# Patient Record
Sex: Male | Born: 1975 | Race: White | Hispanic: No | Marital: Married | Smoking: Never smoker
Health system: Southern US, Community
[De-identification: ages and names within clinical notes are randomized; demographics above are authoritative.]

## PROBLEM LIST (undated history)

## (undated) DIAGNOSIS — J45909 Unspecified asthma, uncomplicated: Secondary | ICD-10-CM

## (undated) DIAGNOSIS — K219 Gastro-esophageal reflux disease without esophagitis: Secondary | ICD-10-CM

## (undated) HISTORY — PX: VASECTOMY: SHX75

---

## 2001-07-06 ENCOUNTER — Ambulatory Visit (HOSPITAL_COMMUNITY): Admission: RE | Admit: 2001-07-06 | Discharge: 2001-07-06 | Payer: Self-pay | Admitting: Family Medicine

## 2001-07-12 ENCOUNTER — Ambulatory Visit (HOSPITAL_COMMUNITY): Admission: RE | Admit: 2001-07-12 | Discharge: 2001-07-12 | Payer: Self-pay | Admitting: Family Medicine

## 2010-09-30 ENCOUNTER — Encounter: Admission: RE | Admit: 2010-09-30 | Discharge: 2010-09-30 | Payer: Self-pay | Admitting: Family Medicine

## 2011-06-10 IMAGING — US US SCROTUM
1 series · 13 of 25 positions shown · non-contrast
Comparison: None.

CLINICAL DATA: Palpable area inferior to the left testicle.

ULTRASOUND OF SCROTUM
TECHNIQUE: Complete ultrasound examination of the testicles,
epididymis, and other scrotal structures was performed.

[Series 1: us scrotum · 0.08mm/px · 13 of 48 slices shown]
[im 1/48]
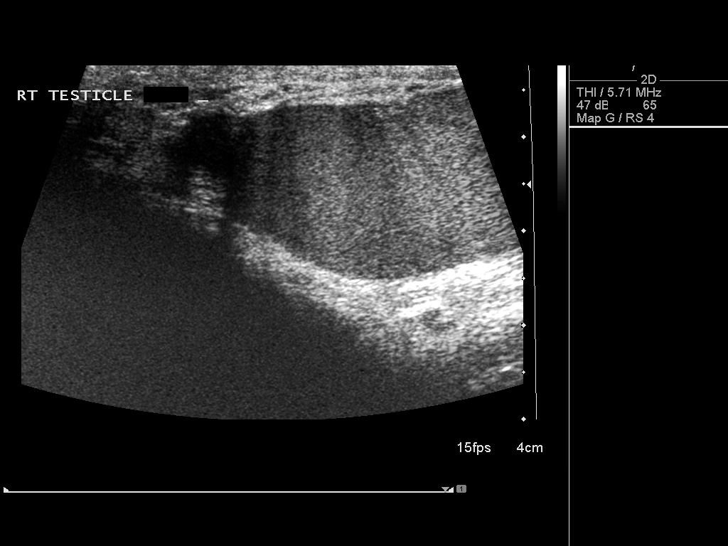
[im 4/48]
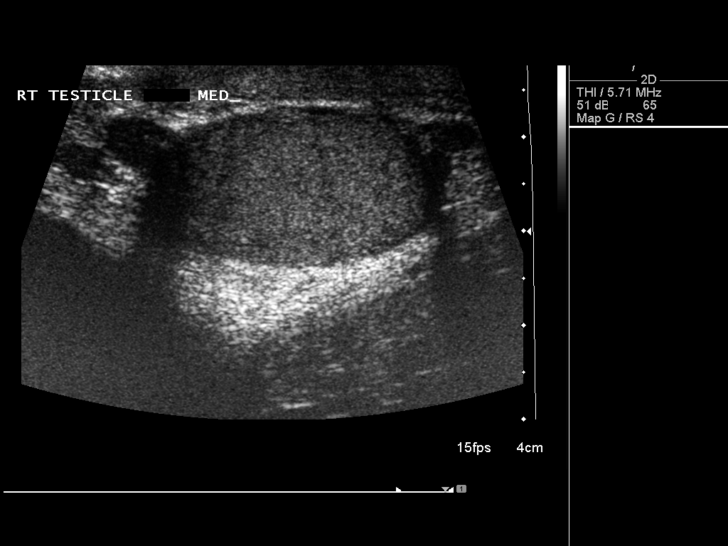
[im 8/48]
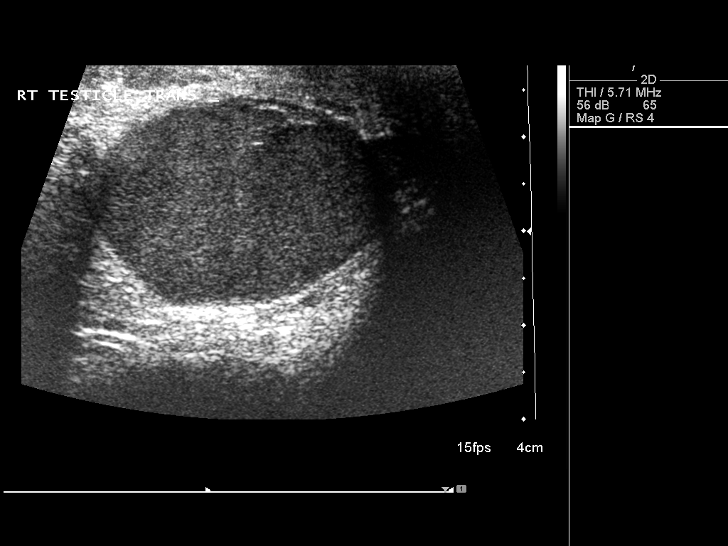
[im 12/48]
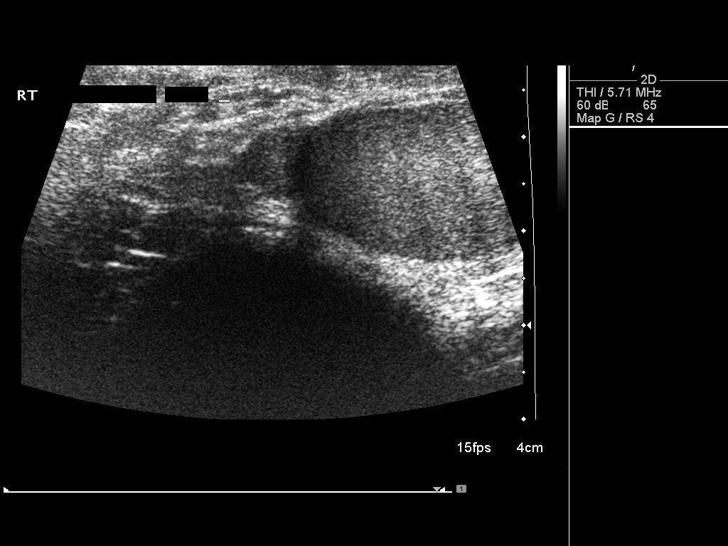
[im 16/48]
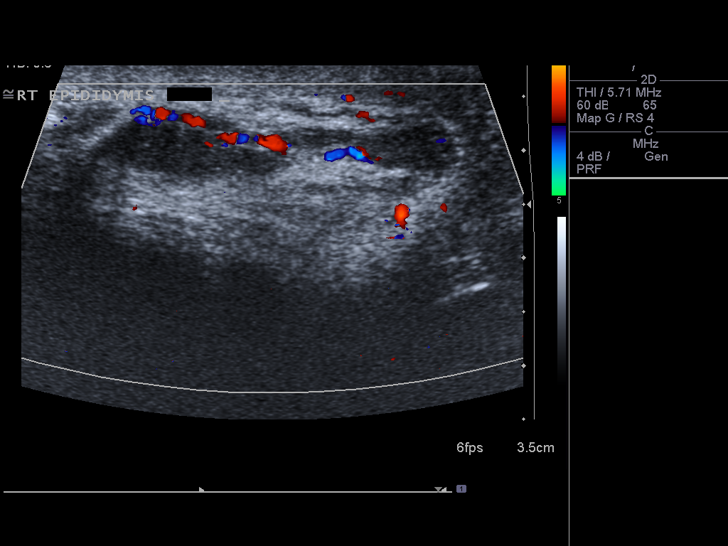
[im 20/48]
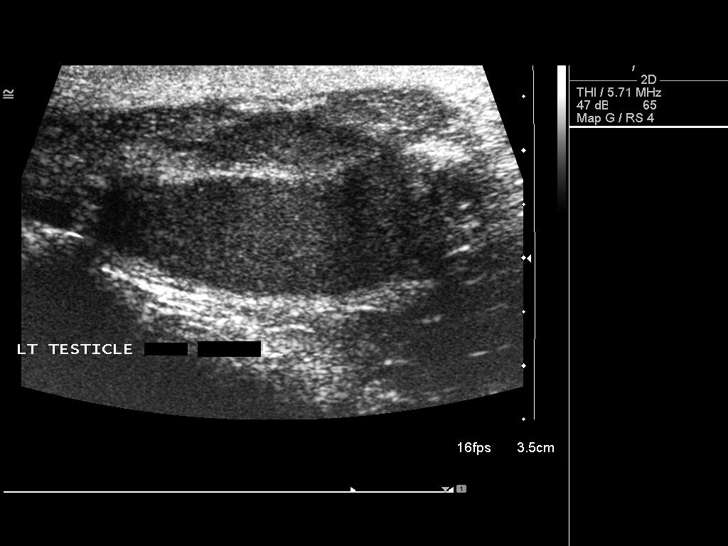
[im 24/48]
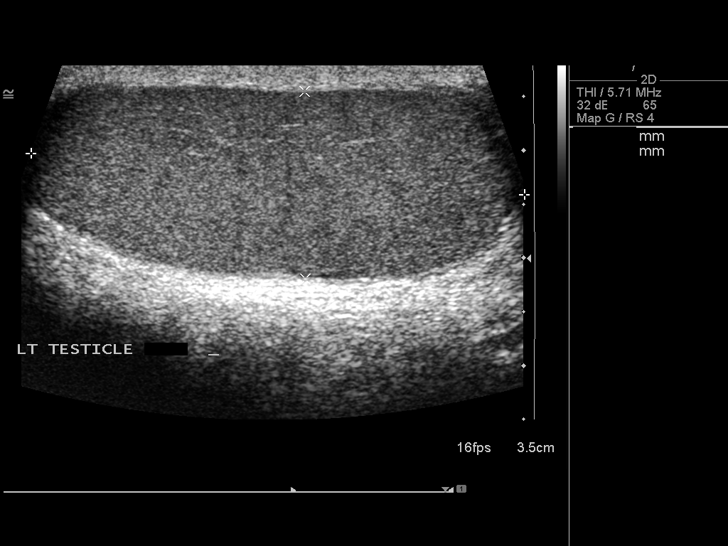
[im 28/48]
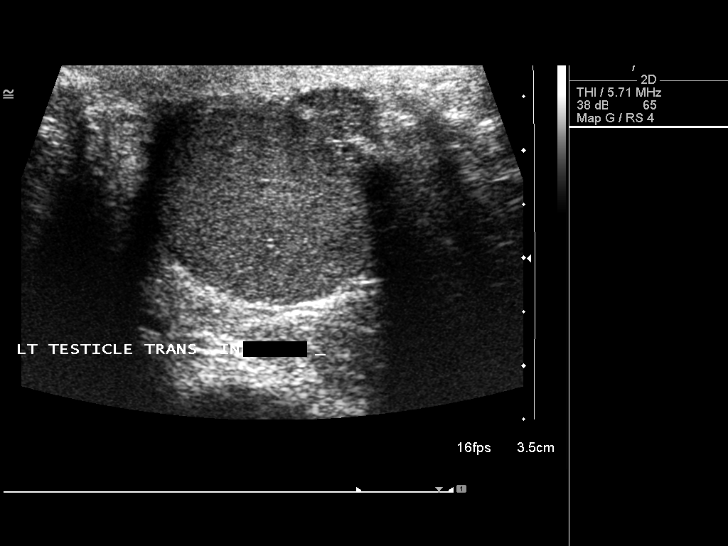
[im 32/48]
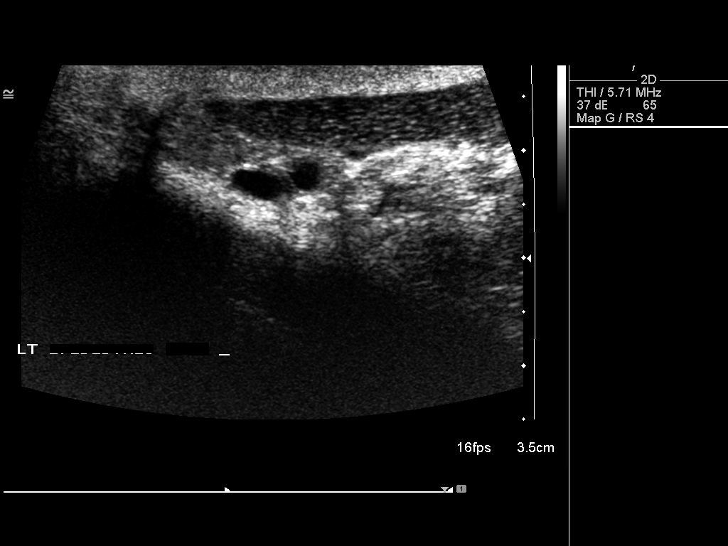
[im 36/48]
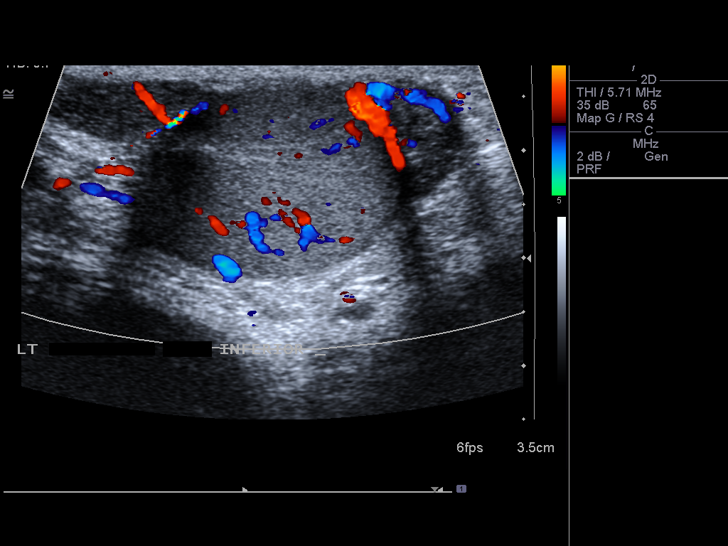
[im 40/48]
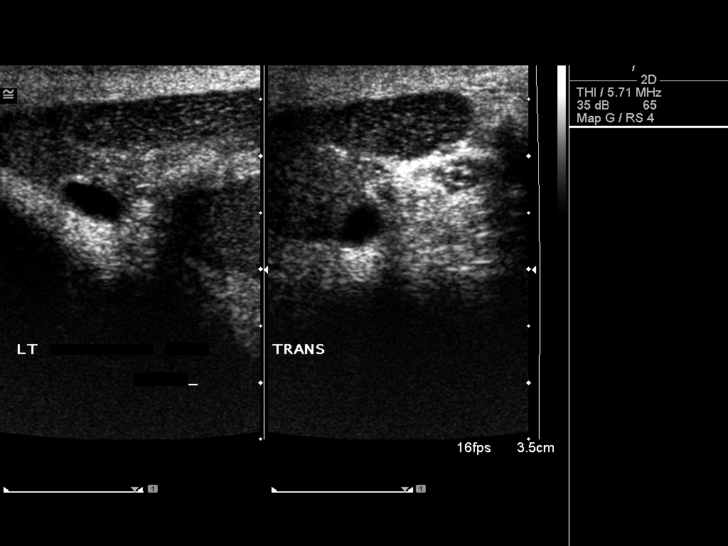
[im 44/48]
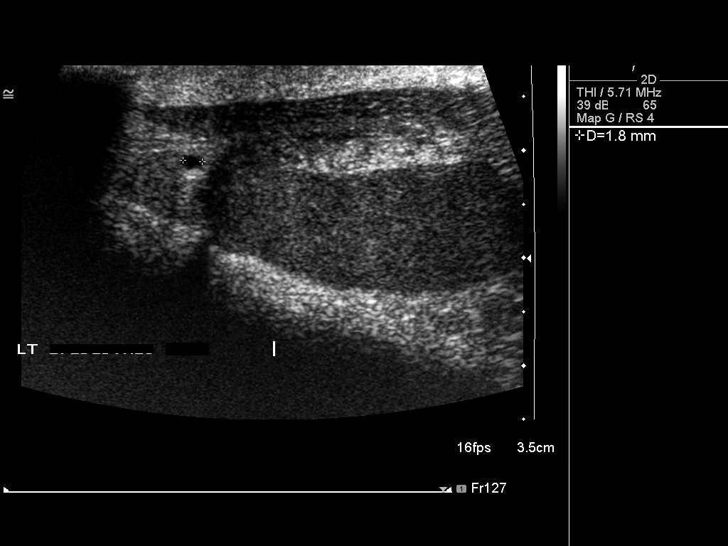
[im 48/48]
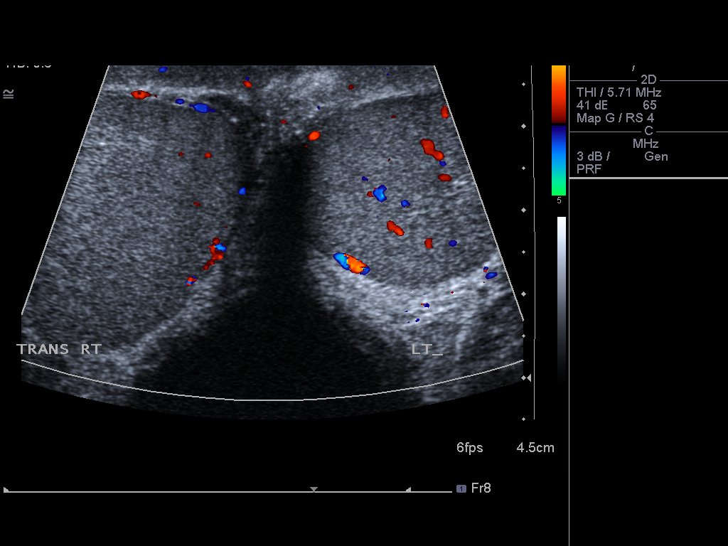

[13 of 25 positions shown; findings below may reference images not displayed]

FINDINGS: Right testis:  Measures 4.0 x 2.0 x 3.2 cm.  Normal echotexture in
the right testicle without a focal lesion.  There is normal color
Doppler flow within the right testicle.

Left testis:  Measures 4.6 x 1.9 x 2.8 cm.  There is normal color
Doppler flow within the left testicle.  No evidence for a left
intratesticular mass.

Right epididymis:  Normal in size and appearance.

Left epididymis:  Left epididymal head measures up to 1.1 cm.
There are least two small cysts in the left epididymal head and the
largest measures 0.5 cm.  The area of interest corresponds with
the inferior aspect of the epididymis.  The inferior epididymis is
hypoechoic and larger compared to the right side.  However, there
is no significant hyperemia within the epididymis.

Hydrocele:  Absent

Varicocele:  Absent
IMPRESSION: Negative for a testicular mass.

The palpable area of interest corresponds with an enlarged and
possibly edematous left epididymis.  The epididymis is not very
hyperemic which suggests that this finding may represent subacute
or old inflammation.

Small left epididymal head cysts.

## 2019-07-21 ENCOUNTER — Emergency Department (HOSPITAL_COMMUNITY)
Admission: EM | Admit: 2019-07-21 | Discharge: 2019-07-21 | Disposition: A | Discharge status: Home / Self Care | Payer: 59 | Attending: Emergency Medicine | Admitting: Emergency Medicine

## 2019-07-21 ENCOUNTER — Other Ambulatory Visit: Payer: Self-pay

## 2019-07-21 ENCOUNTER — Emergency Department (HOSPITAL_COMMUNITY): Payer: 59

## 2019-07-21 ENCOUNTER — Encounter (HOSPITAL_COMMUNITY): Payer: Self-pay

## 2019-07-21 DIAGNOSIS — Z79899 Other long term (current) drug therapy: Secondary | ICD-10-CM | POA: Diagnosis not present

## 2019-07-21 DIAGNOSIS — R059 Cough, unspecified: Secondary | ICD-10-CM

## 2019-07-21 DIAGNOSIS — U071 COVID-19: Secondary | ICD-10-CM | POA: Diagnosis not present

## 2019-07-21 DIAGNOSIS — R05 Cough: Secondary | ICD-10-CM

## 2019-07-21 MED ORDER — BENZONATATE 100 MG PO CAPS: 100.0000 mg | ORAL_CAPSULE | Freq: Three times a day (TID) | ORAL | 0 refills | Status: DC

## 2019-07-21 NOTE — ED Triage Notes (Signed)
Pt began feeling COVID symptoms last Wednesday, and pt tested positive last Saturday.  Pt states he has felt no improvement since the 9 days he began to feel sick.  Pt states his whole family tested COVID +.

## 2019-07-21 NOTE — ED Provider Notes (Signed)
[facility]   CSN: [id number] Arrival date & time: 07/21/19  1707    History   Chief Complaint Cough  HPI Riley Ferguson is a 43 y.o. male past medical history significant for asthma who presents for evaluation of cough.  Patient recently diagnosed with COVID 1 week ago.  States multiple family members have been sick.  Has continued to have productive cough of clear sputum.  Patient states he will get into "coughing fits" and not able to stop.  Does have history of asthma however has not used his rescue inhaler.  Does use Qvar daily for his asthma.  States he did have a temperature 2 days ago of 101 however is not taken his temperature since.  Is been using Tylenol and ibuprofen intermittently for body aches and pains and fever however last use approximately 2 days ago.  Is tolerating p.o. intake at home.  Has congestion and clear rhinorrhea.  Had 2 episodes of nonbloody diarrhea 1 week ago however none since.  Denies headache, neck pain, neck stiffness, chest pain, shortness of breath, hemoptysis, abdominal pain, nausea, vomiting, weakness.  Denies additional aggravating or alleviating factors.  States he called his PCP to see if he could have a prescription for cough medicine however PCP would not see patient due to his positive COVID status.  Does not take any chronic medicines, no history of PE, DVT, heart failure.  History obtained from patient and past medical records.  No interpreter was used.     HPI  History reviewed. No pertinent past medical history.  There are no active problems to display for this patient.   History reviewed. No pertinent surgical history.      Home Medications    Prior to Admission medications   Medication Sig Start Date End Date Taking? Authorizing Provider  albuterol (VENTOLIN HFA) 108 (90 Base) MCG/ACT inhaler Inhale 2 puffs into the lungs every 4 (four) hours as needed for wheezing or shortness of  breath.  02/23/19  Yes [provider]  ibuprofen (ADVIL) 200 MG tablet Take 400 mg by mouth every 6 (six) hours as needed for fever, mild pain or moderate pain.   Yes [provider]  Ibuprofen-diphenhydrAMINE HCl (ADVIL PM) 200-25 MG CAPS Take 1 tablet by mouth at bedtime.   Yes [provider]  levocetirizine (XYZAL) 5 MG tablet Take 5 mg by mouth every evening. 07/10/19  Yes [provider]  QVAR REDIHALER 80 MCG/ACT inhaler Inhale 1 puff into the lungs at bedtime. 04/15/19  Yes [provider]  benzonatate (TESSALON) 100 MG capsule Take 1 capsule (100 mg total) by mouth every 8 (eight) hours. 07/21/19   Denasia Venn A, PA-C    Family History No family history on file.  Social History Social History   Tobacco Use  . Smoking status: Not on file  Substance Use Topics  . Alcohol use: Yes    Alcohol/week: 2.0 standard drinks    Types: 2 Standard drinks or equivalent per week  . Drug use: Not on file     Allergies   Patient has no known allergies.   Review of Systems Review of Systems  Constitutional: Positive for fever (2 days ago).  HENT: Positive for congestion, postnasal drip and rhinorrhea. Negative for ear discharge, ear pain, facial swelling, sinus pressure, sinus pain, sneezing, sore throat, trouble swallowing and voice change.   Respiratory: Positive for cough. Negative for choking, chest tightness, shortness of breath, wheezing and  stridor.   Cardiovascular: Negative.   Gastrointestinal: Positive for diarrhea (1 week ago). Negative for abdominal pain, blood in stool, constipation, nausea, rectal pain and vomiting.  Genitourinary: Negative.   Musculoskeletal: Negative.   Skin: Negative.   Neurological: Negative.   All other systems reviewed and are negative.    Physical Exam Updated Vital Signs BP 132/77   Pulse 79   Temp 98.1 F (36.7 C) (Oral)   Resp (!) 21   Ht 5\' 9"  (1.753 m)   Wt 105.2 kg   SpO2 98%   BMI  34.26 kg/m   Physical Exam Vitals signs and nursing note reviewed.  Constitutional:      General: He is not in acute distress.    Appearance: He is well-developed. He is not ill-appearing, toxic-appearing or diaphoretic.  HENT:     Head: Normocephalic and atraumatic.     Jaw: There is normal jaw occlusion.     Right Ear: Tympanic membrane, ear canal and external ear normal. There is no impacted cerumen. No hemotympanum. Tympanic membrane is not injected, scarred, perforated, erythematous, retracted or bulging.     Left Ear: Tympanic membrane, ear canal and external ear normal. There is no impacted cerumen. No hemotympanum. Tympanic membrane is not injected, scarred, perforated, erythematous, retracted or bulging.     Nose:     Comments: Clear rhinorrhea and congestion to bilateral nares.  No sinus tenderness.    Mouth/Throat:     Comments: Posterior oropharynx clear.  Mucous membranes moist.  Tonsils without erythema or exudate.  Uvula midline without deviation.  No evidence of PTA or RPA.  No drooling, dysphasia or trismus.  Phonation normal. Eyes:     Pupils: Pupils are equal, round, and reactive to light.  Neck:     Musculoskeletal: Normal range of motion and neck supple.     Trachea: Trachea and phonation normal.     Comments: No Neck stiffness or neck rigidity.  No cervical lymphadenopathy. Cardiovascular:     Rate and Rhythm: Normal rate and regular rhythm.     Comments: No murmurs rubs or gallops. Pulmonary:     Effort: Pulmonary effort is normal. No respiratory distress.     Comments: Clear to auscultation bilaterally without wheeze, rhonchi or rales.  No accessory muscle usage.  Able speak in full sentences. Abdominal:     General: There is no distension.     Palpations: Abdomen is soft.     Comments: Soft, nontender without rebound or guarding.  No CVA tenderness.  Musculoskeletal: Normal range of motion.     Comments: Moves all 4 extremities without difficulty.  Lower  extremities without edema, erythema or warmth.  Skin:    General: Skin is warm and dry.     Comments: Brisk capillary refill.  No rashes or lesions.  Neurological:     Mental Status: He is alert.     Comments: Ambulatory in department without difficulty.  Cranial nerves II through XII grossly intact.  No facial droop.  No aphasia.      ED Treatments / Results  Labs (all labs ordered are listed, but only abnormal results are displayed) Labs Reviewed - No data to display  EKG None  Radiology Dg Chest Portable 1 View  Result Date: 07/21/2019 CLINICAL DATA:  COVID-19 positive patient.  Patient feel sick. EXAM: PORTABLE CHEST 1 VIEW COMPARISON:  None. FINDINGS: The heart size and mediastinal contours are within normal limits. Both lungs are clear. The visualized skeletal structures are unremarkable.  IMPRESSION: No active disease. Electronically Signed   By: Dorise Bullion III M.D   On: 07/21/2019 19:31    Procedures Procedures (including critical care time)  Medications Ordered in ED Medications - No data to display   Initial Impression / Assessment and Plan / ED Course  I have reviewed the triage vital signs and the nursing notes.  Pertinent labs & imaging results that were available during my care of the patient were reviewed by me and considered in my medical decision making (see chart for details).  43 year old male appears otherwise well presents for evaluation of cough.  He is afebrile, nonseptic, non-ill-appearing.  Diagnosed with COVID 1 week ago.  Tolerating p.o. intake without difficulty.  Heart and lungs clear.  Speaks in full sentences without difficulty.  No acute respiratory distress.  No neck stiffness or neck rigidity.  Does have clear rhinorrhea to bilateral nares.  Denies any chest pain, shortness of breath.  No evidence of DVT on exam.  Low suspicion for PE.  No hemoptysis. Diarrhea without melena or hematochezia 1 week ago however normal bowel movement since.   Abdomen soft, nontender without rebound or guarding.  On repeat exam patient does not have a surgical abdomin and there are no peritoneal signs.  No indication of appendicitis, bowel obstruction, bowel perforation, cholecystitis, diverticulitis on exam. He is without tachycardia, tachypnea or hypoxia.  Has a productive cough.  Will obtain chest x-ray to rule out secondary bacterial pneumonia.  He appears overall well.  Chest x-ray negative for acute infiltrates, cardiomegaly, pulmonary edema, pneumothorax.  Cough likely related to known COVID infection. He does not appear ill. He is without tachycardia, tachypnea or hypoxia. Ambulates in room with oxygen saturations greater than 98% on room air. No evidence of acute asthma exacerbation.  No new medications to suggest medication side effect as cause of cough. Will DC home with symptomatic management.  Discussed with patient to take his home albuterol inhaler as needed for bronchospasms.  Patient to return for any new or worsening symptoms.  The patient has been appropriately medically screened and/or stabilized in the ED. I have low suspicion for any other emergent medical condition which would require further screening, evaluation or treatment in the ED or require inpatient management.       Riley Ferguson was evaluated in Emergency Department on 07/21/2019 for the symptoms described in the history of present illness. He was evaluated in the context of the global COVID-19 pandemic, which necessitated consideration that the patient might be at risk for infection with the SARS-CoV-2 virus that causes COVID-19. Institutional protocols and algorithms that pertain to the evaluation of patients at risk for COVID-19 are in a state of rapid change based on information released by regulatory bodies including the CDC and federal and state organizations. These policies and algorithms were followed during the patient's care in the ED. Final Clinical Impressions(s) /  ED Diagnoses   Final diagnoses:  OEUMP-53 virus infection  Cough    ED Discharge Orders         Ordered    benzonatate (TESSALON) 100 MG capsule  Every 8 hours     07/21/19 1947           Breah Joa A, PA-C 07/21/19 1950    Daleen Bo, MD 07/22/19 1457

## 2019-07-21 NOTE — Discharge Instructions (Signed)
You may use your home albuterol inhaler as needed for bronchospasms or coughing.  I given you medication to help with your coughing.  Please take as prescribed.  If you develop severe chest pain, shortness of breath please seek reevaluation.  May also take Tylenol ibuprofen alternating for fever.  Do not take more than 4000 mg of Tylenol or more than 2400 mg of ibuprofen daily.

## 2020-03-30 IMAGING — DX PORTABLE CHEST - 1 VIEW
1 series · 1 of 1 positions shown · non-contrast
Comparison: None.

CLINICAL DATA: [id number]-[id number] positive patient.  Patient feel sick.

EXAM:
PORTABLE CHEST 1 VIEW

[chest ap]
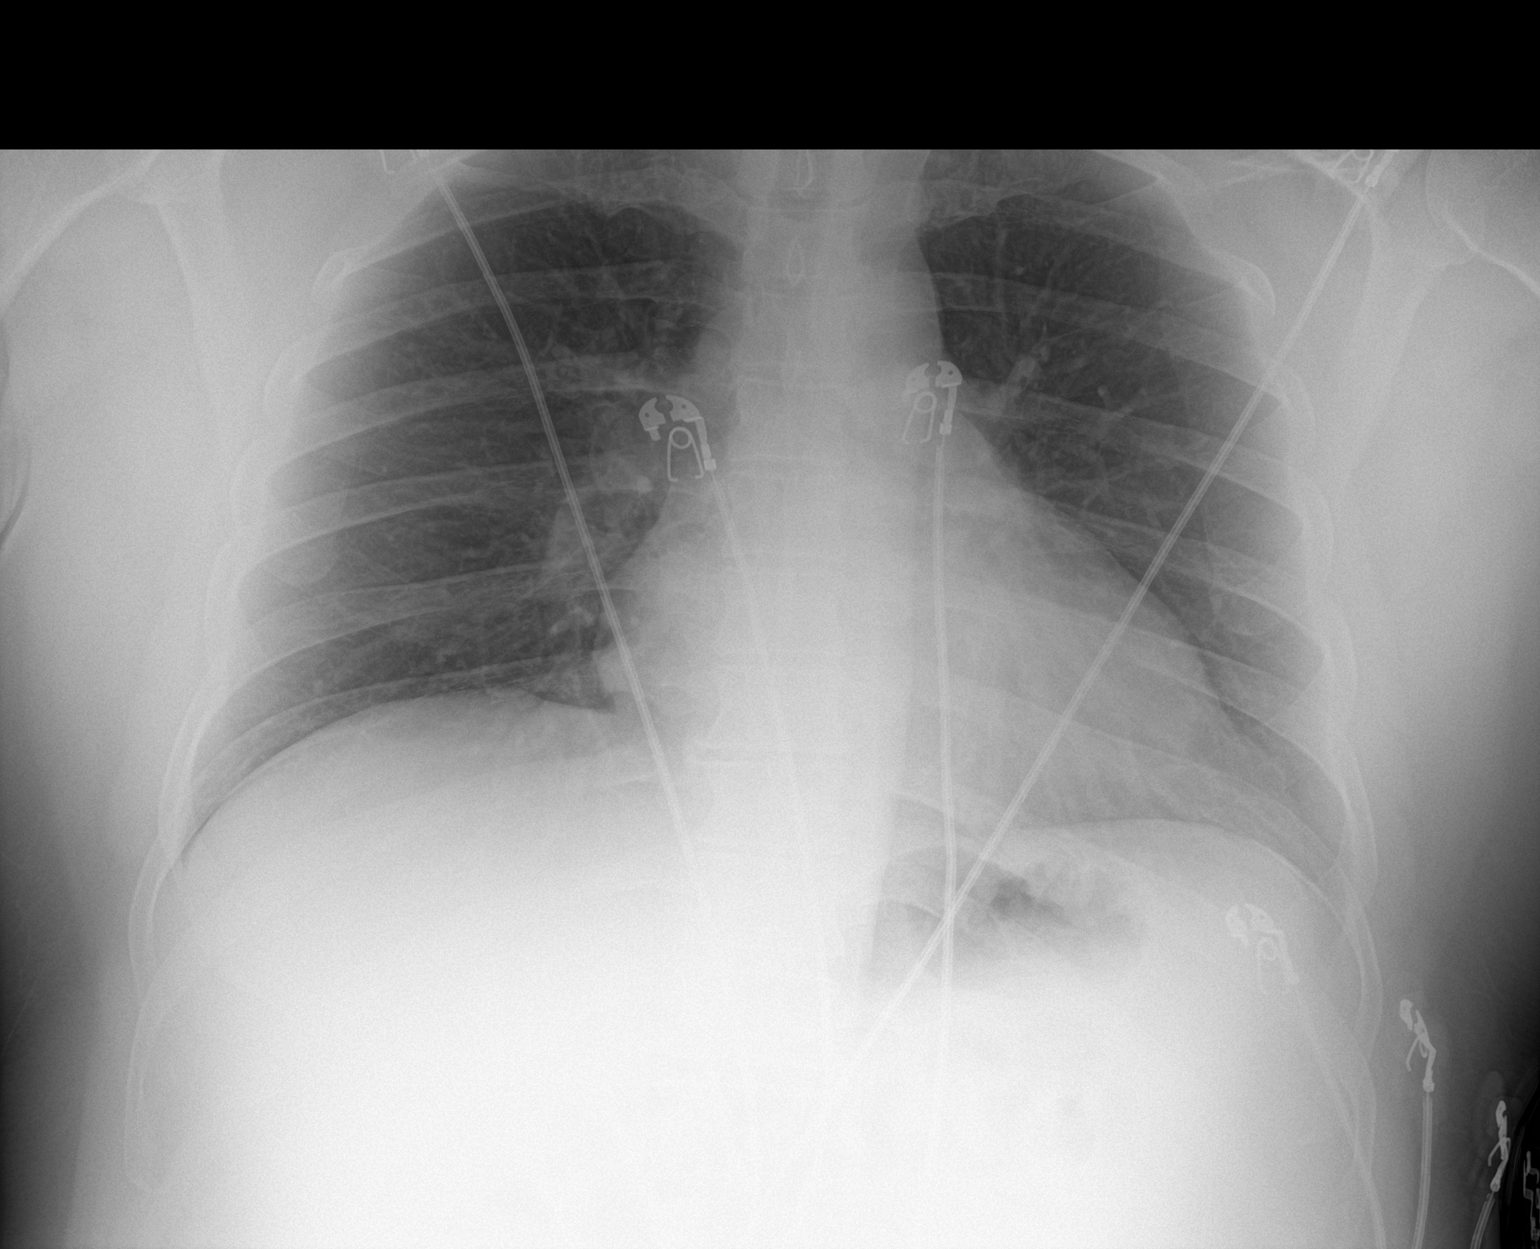

[1 of 1 positions shown; findings below may reference images not displayed]

FINDINGS: The heart size and mediastinal contours are within normal limits.
Both lungs are clear. The visualized skeletal structures are
unremarkable.
IMPRESSION: No active disease.

## 2022-04-21 ENCOUNTER — Ambulatory Visit (INDEPENDENT_AMBULATORY_CARE_PROVIDER_SITE_OTHER): Payer: 59 | Admitting: Internal Medicine

## 2022-04-21 ENCOUNTER — Encounter: Payer: Self-pay | Admitting: Internal Medicine

## 2022-04-21 VITALS — BP 116/74 | HR 99 | Temp 98.1°F | Ht 69.0 in | Wt 234.0 lb

## 2022-04-21 DIAGNOSIS — R053 Chronic cough: Secondary | ICD-10-CM

## 2022-04-21 DIAGNOSIS — Z8709 Personal history of other diseases of the respiratory system: Secondary | ICD-10-CM

## 2022-04-21 MED ORDER — PANTOPRAZOLE SODIUM 40 MG PO TBEC: 40.0000 mg | DELAYED_RELEASE_TABLET | Freq: Two times a day (BID) | ORAL | 3 refills | Status: DC

## 2022-04-21 NOTE — Patient Instructions (Addendum)
Please schedule follow up scheduled with myself in 2 months.  If my schedule is not open yet, we will contact you with a reminder closer to that time. Please call [phone] if you haven't heard from [place] a month before.   I think your cough is likely multi-factorial from reflux and post nasal drainage from allergies. Keep taking your allergy medications.  I will add prescription strength acid reflux medicine for the next 8 weeks to see if that makes a difference in cough. I will get records of the breathing testing from your allergist so you don't have to repeat.   What is GERD? Gastroesophageal reflux disease (GERD) is gastroesophageal reflux diseasewhich occurs when the lower esophageal sphincter (LES) opens spontaneously, for varying periods of time, or does not close properly and stomach contents rise up into the esophagus. GER is also called acid reflux or acid regurgitation, because digestive juices--called acids--rise up with the food. The esophagus is the tube that carries food from the mouth to the stomach. The LES is a ring of muscle at the bottom of the esophagus that acts like a valve between the esophagus and stomach.  When acid reflux occurs, food or fluid can be tasted in the back of the mouth. When refluxed stomach acid touches the lining of the esophagus it may cause a burning sensation in the chest or throat called heartburn or acid indigestion. Occasional reflux is common. Persistent reflux that occurs more than twice a week is considered GERD, and it can eventually lead to more serious health problems. People of all ages can have GERD. Studies have shown that GERD may worsen or contribute to asthma, chronic cough, and pulmonary fibrosis.   What are the symptoms of GERD? The main symptom of GERD in adults is frequent heartburn, also called acid indigestion--burning-type pain in the lower part of the mid-chest, behind the breast bone, and in the mid-abdomen.  Not all reflux is  acidic in nature, and many patients don't have heart burn at all. Sometimes it feels like a cough (either dry or with mucus), choking sensation, asthma, shortness of breath, waking up at night, frequent throat clearing, or trouble swallowing.    What causes GERD? The reason some people develop GERD is still unclear. However, research shows that in people with GERD, the LES relaxes while the rest of the esophagus is working. Anatomical abnormalities such as a hiatal hernia may also contribute to GERD. A hiatal hernia occurs when the upper part of the stomach and the LES move above the diaphragm, the muscle wall that separates the stomach from the chest. Normally, the diaphragm helps the LES keep acid from rising up into the esophagus. When a hiatal hernia is present, acid reflux can occur more easily. A hiatal hernia can occur in people of any age and is most often a normal finding in otherwise healthy people over age 47. Most of the time, a hiatal hernia produces no symptoms.   Other factors that may contribute to GERD include - Obesity or recent weight gain - Pregnancy  - Smoking  - Diet - Certain medications  Common foods that can worsen reflux symptoms include: - carbonated beverages - artificial sweeteners - citrus fruits  - chocolate  - drinks with caffeine or alcohol  - fatty and fried foods  - garlic and onions  - mint flavorings  - spicy foods  - tomato-based foods, like spaghetti sauce, salsa, chili, and pizza   Lifestyle Changes If you smoke, stop.  Avoid foods and beverages that worsen symptoms (see above.) Lose weight if needed.  Eat small, frequent meals.  Wear loose-fitting clothes.  Avoid lying down for 3 hours after a meal.  Raise the head of your bed 6 to 8 inches by securing wood blocks under the bedposts. Just using extra pillows will not help, but using a wedge-shaped pillow may be helpful.  Medications  H2 blockers, such as cimetidine (Tagamet HB), famotidine  (Pepcid AC), nizatidine (Axid AR), and ranitidine (Zantac 75), decrease acid production. They are available in prescription strength and over-the-counter strength. These drugs provide short-term relief and are effective for about half of those who have GERD symptoms.  Proton pump inhibitors include omeprazole (Prilosec, Zegerid), lansoprazole (Prevacid), pantoprazole (Protonix), rabeprazole (Aciphex), and esomeprazole (Nexium), which are available by prescription. Prilosec is also available in over-the-counter strength. Proton pump inhibitors are more effective than H2 blockers and can relieve symptoms and heal the esophageal lining in almost everyone who has GERD.  Because drugs work in different ways, combinations of medications may help control symptoms. People who get heartburn after eating may take both antacids and H2 blockers. The antacids work first to neutralize the acid in the stomach, and then the H2 blockers act on acid production. By the time the antacid stops working, the H2 blocker will have stopped acid production. Your health care provider is the best source of information about how to use medications for GERD.   Points to Remember 1. You can have GERD without having heartburn. Your symptoms could include a dry cough, asthma symptoms, or trouble swallowing.  2. Taking medications daily as prescribed is important in controlling you symptoms.  Sometimes it can take up to 8 weeks to fully achieve the effects of the medications prescribed.  3. Coughing related to GERD can be difficult to treat and is very frustrating!  However, it is important to stick with these medications and lifestyle modifications before pursuing more aggressive or invasive test and treatments.

## 2022-04-21 NOTE — Progress Notes (Signed)
Riley Ferguson    [id number]    May 13, 1976  Primary Care Physician:Patient, No Pcp Per (Inactive)  Referring Physician: Dennie Maizes, NP [address] [place],  [place] [address] Reason for Consultation: cough Date of Consultation: 04/21/2022  Chief complaint:   Chief Complaint  Patient presents with   Consult    He has a dry cough for a few months, shortness of breath with exertion.      HPI: Riley Ferguson is a 46 y.o. man who presents for new patient evaluation of cough.  He first had covid in fall 2020 and then was treated with symbicort and this helped his cough a lot at the time.   He had covid a second time.  He also saw an allergist who tried a couple different inhalers - advair, qvar. Nothing helped.  He doesn't feel albuterol helps him at all for cough or shortness of breath. He never feels like he has gotten a response. Symbicort takes 3-4 weeks for him to feel better. He doesn't feel that symbicort completely relieves his symptoms. Misses evening dose 30% of the time.   He has a past medical history of asthma. He was diagnosed in childhood around age 27. Then symptoms went away for many years. He does note seasonal allergies and takes singulair and xyzal. Xyzal helped his cough a lot.    He does have chest tightness and heart burn. Took 2 weeks of nexium  over the counter and doesn't notice any difference in his cough, but it has helped in the past. He describes an itching in his chest that feels different from his allergy cough.   He does have frequent throat clearing. He wakes up in the morning coughing. He coughs worse after eating. He has runny nose after eating too.   He has taken prednisone for cough. Does not think it helped.   Current Regimen: symbicort 2 puffs twice a day, prn albuterol Asthma Triggers: seasonal allergies.  Exacerbations in the last year: none History of hospitalization or intubation: none Allergy Testing: GERD: yes.   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  Exposures: lives at home with dog, wife's kids, wife.  Smoking history: never smoker, passive smoke exposure as an adult for 6 years in a partner  Social History   Occupational History   Not on file  Tobacco Use   Smoking status: Never    Passive exposure: Never   Smokeless tobacco: Never  Substance and Sexual Activity   Alcohol use: Yes    Alcohol/week: 2.0 standard drinks    Types: 2 Standard drinks or equivalent per week   Drug use: Never   Sexual activity: Not Currently    Relevant family history:  Family History  Problem Relation Age of Onset   Asthma Mother    Asthma Brother     History reviewed. No pertinent past medical history.  Past Surgical History:  Procedure Laterality Date   VASECTOMY       Physical Exam: Blood pressure 116/74, pulse 99, temperature 98.1 F (36.7 C), temperature source Oral, height 5\' 9"  (1.753 m), weight 234 lb (106.1 kg), SpO2 97 %. Gen:      No acute distress ENT:  no nasal polyps, mucus membranes moist, mild cobblestoning in oropharynx Lungs:  No increased respiratory effort, symmetric chest wall excursion, clear to auscultation bilaterally, no wheezes or crackles CV:         Regular rate and rhythm; no murmurs, rubs, or gallops.  No pedal edema Abd:      + bowel sounds; soft, non-tender; no distension MSK: no acute synovitis of DIP or PIP joints, no mechanics hands.  Skin:      Warm and dry; no rashes Neuro: normal speech, no focal facial asymmetry Psych: alert and oriented x3, normal mood and affect   Data Reviewed/Medical Decision Making:  Independent interpretation of tests: Imaging:  Review of patient's chest xray images august 2020 revealed no acute process. The patient's images have been independently reviewed by me.     PFTs:      View : No data to display.          Labs: CBC and CMP reviewed from May 2022 from PCP's office wnl  Immunization status:  Immunization History  Administered Date(s) Administered   PFIZER(Purple Top)SARS-COV-2 Vaccination 02/20/2020, 03/07/2020   Tdap 09/24/2010, 03/26/2021     I reviewed prior external note(s) from [facility] [place] [facility]  I reviewed the result(s) of the labs and imaging as noted above.   I have ordered    Assessment:  Chronic Cough History Asthma  Plan/Recommendations:  I think your cough is likely multi-factorial from reflux and post nasal drainage from allergies. Keep taking your allergy medications.  I will add prescription strength acid reflux medicine for the next 8 weeks to see if that makes a difference in cough. I will get records of the breathing testing from your allergist so you don't have to repeat.  Reasonable to continue asthma therapy for now. The failure to improve with albuterol and prednisone means it's much less likely cough is related to asthma.   We discussed disease management and progression at length today.     Return to Care: Return in about 2 months (around 06/21/2022).  Durel Salts, MD Pulmonary and Critical Care Medicine [facility] [phone]  CC: Dennie Maizes, NP

## 2022-06-22 ENCOUNTER — Ambulatory Visit (INDEPENDENT_AMBULATORY_CARE_PROVIDER_SITE_OTHER): Payer: 59 | Admitting: Internal Medicine

## 2022-06-22 ENCOUNTER — Encounter: Payer: Self-pay | Admitting: Internal Medicine

## 2022-06-22 VITALS — BP 138/76 | HR 67 | Temp 97.8°F | Ht 69.0 in | Wt 234.2 lb

## 2022-06-22 DIAGNOSIS — J301 Allergic rhinitis due to pollen: Secondary | ICD-10-CM

## 2022-06-22 DIAGNOSIS — K219 Gastro-esophageal reflux disease without esophagitis: Secondary | ICD-10-CM

## 2022-06-22 MED ORDER — PANTOPRAZOLE SODIUM 40 MG PO TBEC: 40.0000 mg | DELAYED_RELEASE_TABLET | Freq: Two times a day (BID) | ORAL | 11 refills | Status: AC

## 2022-06-22 NOTE — Patient Instructions (Signed)
Please schedule follow up scheduled with myself in 1 year.  If my schedule is not open yet, we will contact you with a reminder closer to that time. Please call [phone] if you haven't heard from [place] a month before.   Continue acid reflux medicine twice daily. You can try backing off to once a day after a few weeks and see if it still relieves your symptoms. Uncontrolled reflux can lead to precancerous changes in your esophagus called Barrett's Esophagus.   What is GERD? Gastroesophageal reflux disease (GERD) is gastroesophageal reflux diseasewhich occurs when the lower esophageal sphincter (LES) opens spontaneously, for varying periods of time, or does not close properly and stomach contents rise up into the esophagus. GER is also called acid reflux or acid regurgitation, because digestive juices--called acids--rise up with the food. The esophagus is the tube that carries food from the mouth to the stomach. The LES is a ring of muscle at the bottom of the esophagus that acts like a valve between the esophagus and stomach.  When acid reflux occurs, food or fluid can be tasted in the back of the mouth. When refluxed stomach acid touches the lining of the esophagus it may cause a burning sensation in the chest or throat called heartburn or acid indigestion. Occasional reflux is common. Persistent reflux that occurs more than twice a week is considered GERD, and it can eventually lead to more serious health problems. People of all ages can have GERD. Studies have shown that GERD may worsen or contribute to asthma, chronic cough, and pulmonary fibrosis.   What are the symptoms of GERD? The main symptom of GERD in adults is frequent heartburn, also called acid indigestion--burning-type pain in the lower part of the mid-chest, behind the breast bone, and in the mid-abdomen.  Not all reflux is acidic in nature, and many patients don't have heart burn at all. Sometimes it feels like a cough (either dry or  with mucus), choking sensation, asthma, shortness of breath, waking up at night, frequent throat clearing, or trouble swallowing.    What causes GERD? The reason some people develop GERD is still unclear. However, research shows that in people with GERD, the LES relaxes while the rest of the esophagus is working. Anatomical abnormalities such as a hiatal hernia may also contribute to GERD. A hiatal hernia occurs when the upper part of the stomach and the LES move above the diaphragm, the muscle wall that separates the stomach from the chest. Normally, the diaphragm helps the LES keep acid from rising up into the esophagus. When a hiatal hernia is present, acid reflux can occur more easily. A hiatal hernia can occur in people of any age and is most often a normal finding in otherwise healthy people over age 16. Most of the time, a hiatal hernia produces no symptoms.   Other factors that may contribute to GERD include - Obesity or recent weight gain - Pregnancy  - Smoking  - Diet - Certain medications  Common foods that can worsen reflux symptoms include: - carbonated beverages - artificial sweeteners - citrus fruits  - chocolate  - drinks with caffeine or alcohol  - fatty and fried foods  - garlic and onions  - mint flavorings  - spicy foods  - tomato-based foods, like spaghetti sauce, salsa, chili, and pizza   Lifestyle Changes If you smoke, stop.  Avoid foods and beverages that worsen symptoms (see above.) Lose weight if needed.  Eat small, frequent meals.  Wear loose-fitting clothes.  Avoid lying down for 3 hours after a meal.  Raise the head of your bed 6 to 8 inches by securing wood blocks under the bedposts. Just using extra pillows will not help, but using a wedge-shaped pillow may be helpful.  Medications  H2 blockers, such as cimetidine (Tagamet HB), famotidine (Pepcid AC), nizatidine (Axid AR), and ranitidine (Zantac 75), decrease acid production. They are available in  prescription strength and over-the-counter strength. These drugs provide short-term relief and are effective for about half of those who have GERD symptoms.  Proton pump inhibitors include omeprazole (Prilosec, Zegerid), lansoprazole (Prevacid), pantoprazole (Protonix), rabeprazole (Aciphex), and esomeprazole (Nexium), which are available by prescription. Prilosec is also available in over-the-counter strength. Proton pump inhibitors are more effective than H2 blockers and can relieve symptoms and heal the esophageal lining in almost everyone who has GERD.  Because drugs work in different ways, combinations of medications may help control symptoms. People who get heartburn after eating may take both antacids and H2 blockers. The antacids work first to neutralize the acid in the stomach, and then the H2 blockers act on acid production. By the time the antacid stops working, the H2 blocker will have stopped acid production. Your health care provider is the best source of information about how to use medications for GERD.   Points to Remember 1. You can have GERD without having heartburn. Your symptoms could include a dry cough, asthma symptoms, or trouble swallowing.  2. Taking medications daily as prescribed is important in controlling you symptoms.  Sometimes it can take up to 8 weeks to fully achieve the effects of the medications prescribed.  3. Coughing related to GERD can be difficult to treat and is very frustrating!  However, it is important to stick with these medications and lifestyle modifications before pursuing more aggressive or invasive test and treatments.

## 2022-06-22 NOTE — Progress Notes (Unsigned)
         Riley Ferguson    [id number]    October 21, 1976  Primary Care Physician:Patient, No Pcp Per Date of Appointment: 06/23/2022 Established Patient Visit  Chief complaint:   Chief Complaint  Patient presents with   Follow-up    Cough resolved      HPI: Riley Ferguson a is a 46 y.o. man with chronic cough.  Interval Updates: Here for follow up. Cough is resolved since starting PPI BID. Thinks it was all related to reflux. He denotes eating a fairly liberal diet when it comes to caffeine, alcohol, carbonated beverages, fried foods.  Does have some seasonal post nasal drainage but this year it just never went away until he tried the PPI.   I have reviewed the patient's family social and past medical history and updated as appropriate.   History reviewed. No pertinent past medical history.  Past Surgical History:  Procedure Laterality Date   VASECTOMY      Family History  Problem Relation Age of Onset   Asthma Mother    Asthma Brother     Social History   Occupational History   Not on file  Tobacco Use   Smoking status: Never    Passive exposure: Never   Smokeless tobacco: Never  Substance and Sexual Activity   Alcohol use: Yes    Alcohol/week: 2.0 standard drinks of alcohol    Types: 2 Standard drinks or equivalent per week   Drug use: Never   Sexual activity: Not Currently     Physical Exam: Blood pressure 138/76, pulse 67, temperature 97.8 F (36.6 C), temperature source Oral, height 5\' 9"  (1.753 m), weight 234 lb 3.2 oz (106.2 kg), SpO2 99 %.  Gen:      No acute distress ENT:  no nasal polyps, mucus membranes moist, mallampati IV Lungs:    No increased respiratory effort, symmetric chest wall excursion, clear to auscultation bilaterally, no wheezes or crackles CV:         Regular rate and rhythm; no murmurs, rubs, or gallops.  No pedal edema   Data Reviewed: Imaging: I have personally reviewed the   PFTs:      No data to display          I have personally reviewed the patient's PFTs and wnl in 2023  Labs:  Immunization status: Immunization History  Administered Date(s) Administered   PFIZER(Purple Top)SARS-COV-2 Vaccination 02/20/2020, 03/07/2020   Tdap 09/24/2010, 03/26/2021    External Records Personally Reviewed:   Assessment:  Chronic Cough - GERD Chronic allergic rhinitis  Plan/Recommendations: Continue PPI BID - would taper off to once daily in a few weeks and see if still efficacious. Discussed lifestyle modification at length for reflux.  Continue allergy medication   Return to Care: Return in about 1 year (around 06/23/2023).   06/25/2023, MD Pulmonary and Critical Care Medicine [facility] [place] [facility] Office:[phone]

## 2022-06-23 ENCOUNTER — Encounter: Payer: Self-pay | Admitting: Internal Medicine

## 2022-07-21 ENCOUNTER — Other Ambulatory Visit: Payer: Self-pay | Admitting: Urology

## 2022-09-07 ENCOUNTER — Encounter (HOSPITAL_BASED_OUTPATIENT_CLINIC_OR_DEPARTMENT_OTHER): Payer: Self-pay | Admitting: Urology

## 2022-09-07 NOTE — Progress Notes (Signed)
Spoke w/ via phone for pre-op interview--- Riley Ferguson Lab needs dos----   NONE            Lab results------ COVID test -----patient states asymptomatic no test needed Arrive at -------1215 NPO after MN NO Solid Food.  Clear liquids from MN until---1115 Med rec completed Medications to take morning of surgery -----Symbicort and Protonix Diabetic medication ----- Patient instructed no nail polish to be worn day of surgery Patient instructed to bring photo id and insurance card day of surgery Patient aware to have Driver (ride ) / caregiver  Wife Riley Ferguson  for 24 hours after surgery  Patient Special Instructions ----- Pre-Op special Istructions ----- Patient verbalized understanding of instructions that were given at this phone interview. Patient denies shortness of breath, chest pain, fever, cough at this phone interview.

## 2022-09-15 ENCOUNTER — Ambulatory Visit (HOSPITAL_BASED_OUTPATIENT_CLINIC_OR_DEPARTMENT_OTHER): Admission: RE | Admit: 2022-09-15 | Payer: 59 | Source: Ambulatory Visit | Admitting: Urology

## 2022-09-15 HISTORY — DX: Unspecified asthma, uncomplicated: J45.909

## 2022-09-15 HISTORY — DX: Gastro-esophageal reflux disease without esophagitis: K21.9

## 2022-09-15 SURGERY — CIRCUMCISION, ADULT
Anesthesia: General

## 2023-08-13 ENCOUNTER — Other Ambulatory Visit: Payer: Self-pay | Admitting: Urology

## 2023-09-28 ENCOUNTER — Encounter (HOSPITAL_BASED_OUTPATIENT_CLINIC_OR_DEPARTMENT_OTHER): Payer: Self-pay | Admitting: Urology

## 2023-09-28 NOTE — Progress Notes (Signed)
Spoke w/ via phone for pre-op interview--- Riley Ferguson needs dos----  NONE       Ferguson results------ COVID test -----patient states asymptomatic no test needed Arrive at -------1100 NPO after MN NO Solid Food.  Clear liquids from MN until---1000 Med rec completed Medications to take morning of surgery -----Symbicort, Protonix and Xyzal. Diabetic medication ----- Patient instructed no nail polish to be worn day of surgery Patient instructed to bring photo id and insurance card day of surgery Patient aware to have Driver (ride ) / caregiver    for 24 hours after surgery - wife- Riley Ferguson Patient Special Instructions ----- Pre-Op special Instructions ----- Patient verbalized understanding of instructions that were given at this phone interview. Patient denies chest pain, sob, fever, cough at the interview.

## 2023-10-04 NOTE — Anesthesia Preprocedure Evaluation (Signed)
Anesthesia Evaluation  Patient identified by MRN, date of birth, ID band Patient awake    Reviewed: Allergy & Precautions, NPO status , Patient's Chart, lab work & pertinent test results  Airway Mallampati: IV  TM Distance: >3 FB Neck ROM: Full    Dental  (+) Teeth Intact, Dental Advisory Given   Pulmonary asthma (symbicort)    Pulmonary exam normal breath sounds clear to auscultation       Cardiovascular negative cardio ROS Normal cardiovascular exam Rhythm:Regular Rate:Normal     Neuro/Psych negative neurological ROS  negative psych ROS   GI/Hepatic Neg liver ROS,GERD  Medicated and Controlled,,  Endo/Other  Obesity BMI 35  Renal/GU negative Renal ROS  negative genitourinary   Musculoskeletal negative musculoskeletal ROS (+)    Abdominal  (+) + obese  Peds  Hematology negative hematology ROS (+)   Anesthesia Other Findings   Reproductive/Obstetrics negative OB ROS                             Anesthesia Physical Anesthesia Plan  ASA: 2  Anesthesia Plan: General   Post-op Pain Management: Tylenol PO (pre-op)*, Toradol IV (intra-op)* and Precedex   Induction: Intravenous  PONV Risk Score and Plan: 3 and Ondansetron, Dexamethasone, Midazolam and Treatment may vary due to age or medical condition  Airway Management Planned: LMA  Additional Equipment: None  Intra-op Plan:   Post-operative Plan: Extubation in OR  Informed Consent: I have reviewed the patients History and Physical, chart, labs and discussed the procedure including the risks, benefits and alternatives for the proposed anesthesia with the patient or authorized representative who has indicated his/her understanding and acceptance.     Dental advisory given  Plan Discussed with: CRNA  Anesthesia Plan Comments:        Anesthesia Quick Evaluation

## 2023-10-05 ENCOUNTER — Ambulatory Visit (HOSPITAL_BASED_OUTPATIENT_CLINIC_OR_DEPARTMENT_OTHER): Payer: Self-pay | Admitting: Anesthesiology

## 2023-10-05 ENCOUNTER — Encounter (HOSPITAL_BASED_OUTPATIENT_CLINIC_OR_DEPARTMENT_OTHER)
Admission: RE | Disposition: A | Discharge status: Home / Self Care | Payer: Self-pay | Source: Home / Self Care | Attending: Urology

## 2023-10-05 ENCOUNTER — Encounter (HOSPITAL_BASED_OUTPATIENT_CLINIC_OR_DEPARTMENT_OTHER): Payer: Self-pay | Admitting: Urology

## 2023-10-05 ENCOUNTER — Ambulatory Visit (HOSPITAL_BASED_OUTPATIENT_CLINIC_OR_DEPARTMENT_OTHER)
Admission: RE | Admit: 2023-10-05 | Discharge: 2023-10-05 | Disposition: A | Discharge status: Home / Self Care | Payer: 59 | Attending: Urology | Admitting: Urology

## 2023-10-05 ENCOUNTER — Other Ambulatory Visit: Payer: Self-pay

## 2023-10-05 ENCOUNTER — Ambulatory Visit (HOSPITAL_BASED_OUTPATIENT_CLINIC_OR_DEPARTMENT_OTHER): Payer: 59 | Admitting: Anesthesiology

## 2023-10-05 DIAGNOSIS — K219 Gastro-esophageal reflux disease without esophagitis: Secondary | ICD-10-CM | POA: Diagnosis not present

## 2023-10-05 DIAGNOSIS — N475 Adhesions of prepuce and glans penis: Secondary | ICD-10-CM | POA: Diagnosis present

## 2023-10-05 DIAGNOSIS — N4889 Other specified disorders of penis: Secondary | ICD-10-CM

## 2023-10-05 DIAGNOSIS — Z7951 Long term (current) use of inhaled steroids: Secondary | ICD-10-CM | POA: Insufficient documentation

## 2023-10-05 DIAGNOSIS — J45909 Unspecified asthma, uncomplicated: Secondary | ICD-10-CM | POA: Insufficient documentation

## 2023-10-05 HISTORY — PX: CIRCUMCISION: SHX1350

## 2023-10-05 SURGERY — CIRCUMCISION, ADULT
Anesthesia: General | Site: Penis

## 2023-10-05 MED ORDER — CELECOXIB 200 MG PO CAPS: 200.0000 mg | ORAL_CAPSULE | Freq: Two times a day (BID) | ORAL | 1 refills | Status: AC

## 2023-10-05 MED ORDER — ONDANSETRON HCL 4 MG/2ML IJ SOLN: 4.0000 mg | Freq: Once | INTRAMUSCULAR | Status: DC | PRN

## 2023-10-05 MED ORDER — HYDROMORPHONE HCL 1 MG/ML IJ SOLN: 0.2500 mg | INTRAMUSCULAR | Status: DC | PRN

## 2023-10-05 MED ORDER — PROPOFOL 10 MG/ML IV BOLUS
INTRAVENOUS | Status: AC
  Filled 2023-10-05: qty 20

## 2023-10-05 MED ORDER — LIDOCAINE HCL (CARDIAC) PF 100 MG/5ML IV SOSY
PREFILLED_SYRINGE | INTRAVENOUS | Status: DC | PRN
  Administered 2023-10-05: 50 mg via INTRAVENOUS

## 2023-10-05 MED ORDER — AMISULPRIDE (ANTIEMETIC) 5 MG/2ML IV SOLN: 10.0000 mg | Freq: Once | INTRAVENOUS | Status: DC | PRN

## 2023-10-05 MED ORDER — FENTANYL CITRATE (PF) 100 MCG/2ML IJ SOLN
INTRAMUSCULAR | Status: DC | PRN
  Administered 2023-10-05: 100 ug via INTRAVENOUS

## 2023-10-05 MED ORDER — MIDAZOLAM HCL 2 MG/2ML IJ SOLN
INTRAMUSCULAR | Status: DC | PRN
  Administered 2023-10-05: 2 mg via INTRAVENOUS

## 2023-10-05 MED ORDER — TRAMADOL HCL 50 MG PO TABS: 50.0000 mg | ORAL_TABLET | Freq: Four times a day (QID) | ORAL | 0 refills | Status: AC | PRN

## 2023-10-05 MED ORDER — ACETAMINOPHEN 500 MG PO TABS
ORAL_TABLET | ORAL | Status: AC
Start: 2023-10-05 — End: ?
  Filled 2023-10-05: qty 2

## 2023-10-05 MED ORDER — CEFAZOLIN SODIUM-DEXTROSE 2-4 GM/100ML-% IV SOLN
INTRAVENOUS | Status: AC
  Filled 2023-10-05: qty 100

## 2023-10-05 MED ORDER — DEXAMETHASONE SODIUM PHOSPHATE 4 MG/ML IJ SOLN
INTRAMUSCULAR | Status: DC | PRN
  Administered 2023-10-05: 8 mg via INTRAVENOUS

## 2023-10-05 MED ORDER — MIDAZOLAM HCL 2 MG/2ML IJ SOLN
INTRAMUSCULAR | Status: AC
Start: 2023-10-05 — End: ?
  Filled 2023-10-05: qty 2

## 2023-10-05 MED ORDER — DEXMEDETOMIDINE HCL IN NACL 80 MCG/20ML IV SOLN
INTRAVENOUS | Status: DC | PRN
  Administered 2023-10-05: 8 ug via INTRAVENOUS

## 2023-10-05 MED ORDER — OXYCODONE HCL 5 MG/5ML PO SOLN: 5.0000 mg | Freq: Once | ORAL | Status: DC | PRN

## 2023-10-05 MED ORDER — STERILE WATER FOR IRRIGATION IR SOLN
Status: DC | PRN
Start: 2023-10-05 — End: 2023-10-05
  Administered 2023-10-05: 500 mL

## 2023-10-05 MED ORDER — PROPOFOL 10 MG/ML IV BOLUS
INTRAVENOUS | Status: DC | PRN
  Administered 2023-10-05: 100 mg via INTRAVENOUS
  Administered 2023-10-05: 200 mg via INTRAVENOUS

## 2023-10-05 MED ORDER — ACETAMINOPHEN 500 MG PO TABS
1000.0000 mg | ORAL_TABLET | Freq: Once | ORAL | Status: AC
Start: 2023-10-05 — End: 2023-10-05
  Administered 2023-10-05: 1000 mg via ORAL

## 2023-10-05 MED ORDER — SODIUM CHLORIDE 0.9 % IV SOLN: INTRAVENOUS | Status: DC

## 2023-10-05 MED ORDER — BUPIVACAINE HCL 0.25 % IJ SOLN
INTRAMUSCULAR | Status: DC | PRN
  Administered 2023-10-05: 16 mL

## 2023-10-05 MED ORDER — KETOROLAC TROMETHAMINE 30 MG/ML IJ SOLN: 30.0000 mg | Freq: Once | INTRAMUSCULAR | Status: DC | PRN

## 2023-10-05 MED ORDER — FENTANYL CITRATE (PF) 100 MCG/2ML IJ SOLN
INTRAMUSCULAR | Status: AC
  Filled 2023-10-05: qty 2

## 2023-10-05 MED ORDER — CEFAZOLIN SODIUM-DEXTROSE 2-4 GM/100ML-% IV SOLN
2.0000 g | INTRAVENOUS | Status: AC
  Administered 2023-10-05: 2 g via INTRAVENOUS

## 2023-10-05 MED ORDER — OXYCODONE HCL 5 MG PO TABS: 5.0000 mg | ORAL_TABLET | Freq: Once | ORAL | Status: DC | PRN

## 2023-10-05 MED ORDER — LACTATED RINGERS IV SOLN: INTRAVENOUS | Status: DC

## 2023-10-05 MED ORDER — ONDANSETRON HCL 4 MG/2ML IJ SOLN
INTRAMUSCULAR | Status: DC | PRN
  Administered 2023-10-05: 4 mg via INTRAVENOUS

## 2023-10-05 MED ORDER — MEPERIDINE HCL 25 MG/ML IJ SOLN: 6.2500 mg | INTRAMUSCULAR | Status: DC | PRN

## 2023-10-05 SURGICAL SUPPLY — 28 items
BENZOIN TINCTURE PRP APPL 2/3 (GAUZE/BANDAGES/DRESSINGS) IMPLANT
BLADE SURG 15 STRL LF DISP TIS (BLADE) ×1 IMPLANT
BLADE SURG 15 STRL SS (BLADE) ×1
BNDG ADH 1X3 SHEER STRL LF (GAUZE/BANDAGES/DRESSINGS) IMPLANT
BNDG COHESIVE 1X5 TAN STRL LF (GAUZE/BANDAGES/DRESSINGS) ×1 IMPLANT
COVER BACK TABLE 60X90IN (DRAPES) ×1 IMPLANT
COVER MAYO STAND STRL (DRAPES) ×1 IMPLANT
DRAPE LAPAROTOMY T 98X78 PEDS (DRAPES) ×1 IMPLANT
ELECT NDL TIP 2.8 STRL (NEEDLE) ×1 IMPLANT
ELECT NEEDLE TIP 2.8 STRL (NEEDLE)
ELECT REM PT RETURN 9FT ADLT (ELECTROSURGICAL) ×1
ELECTRODE REM PT RTRN 9FT ADLT (ELECTROSURGICAL) ×1 IMPLANT
GAUZE PETROLATUM 1 X8 (GAUZE/BANDAGES/DRESSINGS) ×1 IMPLANT
GAUZE STRETCH 2X75IN STRL (MISCELLANEOUS) ×1 IMPLANT
GAUZE VASELINE FOILPK 1/2 X 72 (GAUZE/BANDAGES/DRESSINGS) IMPLANT
GLOVE BIO SURGEON STRL SZ7 (GLOVE) ×1 IMPLANT
KIT TURNOVER CYSTO (KITS) IMPLANT
NS IRRIG 1000ML POUR BTL (IV SOLUTION) ×1 IMPLANT
PACK BASIN DAY SURGERY FS (CUSTOM PROCEDURE TRAY) ×1 IMPLANT
PENCIL SMOKE EVACUATOR (MISCELLANEOUS) ×1 IMPLANT
SLEEVE SCD COMPRESS KNEE MED (STOCKING) ×1 IMPLANT
SUT CHROMIC 3 0 SH 27 (SUTURE) ×1 IMPLANT
SUT CHROMIC 4 0 RB 1X27 (SUTURE) ×1 IMPLANT
SYR CONTROL 10ML LL (SYRINGE) ×1 IMPLANT
TOWEL OR 17X24 6PK STRL BLUE (TOWEL DISPOSABLE) ×1 IMPLANT
TRAY DSU PREP LF (CUSTOM PROCEDURE TRAY) ×1 IMPLANT
WATER STERILE IRR 1000ML POUR (IV SOLUTION) IMPLANT
WATER STERILE IRR 500ML POUR (IV SOLUTION) IMPLANT

## 2023-10-05 NOTE — Anesthesia Procedure Notes (Signed)
Procedure Name: LMA Insertion Date/Time: 10/05/2023 2:15 PM  Performed by: Earmon Phoenix, CRNAPre-anesthesia Checklist: Patient identified, Emergency Drugs available, Suction available, Patient being monitored and Timeout performed Patient Re-evaluated:Patient Re-evaluated prior to induction Oxygen Delivery Method: Circle system utilized Preoxygenation: Pre-oxygenation with 100% oxygen Induction Type: IV induction Ventilation: Mask ventilation without difficulty LMA: LMA inserted LMA Size: 4.0 Number of attempts: 1 Placement Confirmation: positive ETCO2 and breath sounds checked- equal and bilateral Tube secured with: Tape Dental Injury: Teeth and Oropharynx as per pre-operative assessment

## 2023-10-05 NOTE — Op Note (Signed)
Pre-operative diagnosis: Penile adhesions  Postoperative diagnosis: same  Procedure:  Circumcision revision Penile block  Surgeon: Irine Seal. M.D.  Anesthesia: General  Complications: None  EBL: Minimal  Specimens: Foreskin (not sent for pathology)  Indication: Riley Ferguson is a 47 y.o. male who was diagnosed with above.  He elected to proceed with circumcision for treatment. The potential risks, complications, alternative options, and expected recovery process was discussed in detail and informed consent was obtained.  Description of procedure: The patient was taken to the operating room and a general anesthetic was administered. He was given preoperative antibiotics, placed in the supine position, and his genitalia was prepped and draped in the usual sterile fashion. Next, a preoperative timeout was performed.  A dorsal and circumferential penile block was done using 0.25 bupivacaine.   The penis was examined. Glanular adhesions ran from around 10 to 2 oclock dorsallly. I was able to reduce these manually. To prevent recurrence, I measured out a small area of extra skin that was probably around 3.5 cm x 0.5 cm in a semilunar shape. This was sharply excised. The skin was then closed with interrupted 4-0 chromic sutures.  A gauze dressing was then placed and secured with [place].   The patient tolerated the procedure well and without complications. He was able to be awakened and transferred to the recovery unit in satisfactory condition.  Irine Seal MD 10/05/2023, 2:56 PM  [facility]  Pager: [phone]

## 2023-10-05 NOTE — H&P (Signed)
H&P  History of Present Illness: Riley Ferguson is a 47 y.o. year old M who presents today for circ revision. No acute complaints  Past Medical History:  Diagnosis Date   Asthma    GERD (gastroesophageal reflux disease)     Past Surgical History:  Procedure Laterality Date   VASECTOMY      Home Medications:  Current Meds  Medication Sig   budesonide-formoterol (SYMBICORT) 160-4.5 MCG/ACT inhaler SMARTSIG:2 Puff(s) Via Inhaler Twice Daily PRN   Ibuprofen-diphenhydrAMINE HCl (ADVIL PM) 200-25 MG CAPS Take 1 tablet by mouth at bedtime.   levocetirizine (XYZAL) 5 MG tablet Take 5 mg by mouth every evening.   montelukast (SINGULAIR) 10 MG tablet Take 10 mg by mouth daily.   pantoprazole (PROTONIX) 40 MG tablet Take 1 tablet (40 mg total) by mouth 2 (two) times daily before a meal.    Allergies: No Known Allergies  Family History  Problem Relation Age of Onset   Asthma Mother    Asthma Brother     Social History:  reports that he has never smoked. He has never been exposed to tobacco smoke. He has never used smokeless tobacco. He reports current alcohol use of about 2.0 standard drinks of alcohol per week. He reports that he does not use drugs.  ROS: A complete review of systems was performed.  All systems are negative except for pertinent findings as noted.  Physical Exam:  Vital signs in last 24 hours: Temp:  [97.4 F (36.3 C)] 97.4 F (36.3 C) (11/12 1127) Pulse Rate:  [64] 64 (11/12 1127) Resp:  [16] 16 (11/12 1127) BP: (155)/(89) 155/89 (11/12 1127) SpO2:  [98 %] 98 % (11/12 1127) Weight:  [110.9 kg] 110.9 kg (11/12 1127) Constitutional:  Alert and oriented, No acute distress Cardiovascular: Regular rate and rhythm Respiratory: Normal respiratory effort, Lungs clear bilaterally GI: Abdomen is soft, nontender, nondistended, no abdominal masses Lymphatic: No lymphadenopathy Neurologic: Grossly intact, no focal deficits Psychiatric: Normal mood and  affect   Laboratory Data:  No results for input(s): "WBC", "HGB", "HCT", "PLT" in the last 72 hours.  No results for input(s): "NA", "K", "CL", "GLUCOSE", "BUN", "CALCIUM", "CREATININE" in the last 72 hours.  Invalid input(s): "CO3"   No results found for this or any previous visit (from the past 24 hour(s)). No results found for this or any previous visit (from the past 240 hour(s)).  Renal Function: No results for input(s): "CREATININE" in the last 168 hours. CrCl cannot be calculated (No successful lab value found.).  Radiologic Imaging: No results found.  Assessment:  Riley Ferguson is a 47 y.o. year old M with foreskin adhesions  Plan:  To OR for circumcision revision, lysis of adhesions. Procedure and risks reviewed  Irine Seal, MD 10/05/2023, 2:05 PM  [facility] Pager: [phone]

## 2023-10-05 NOTE — Transfer of Care (Signed)
Immediate Anesthesia Transfer of Care Note  Patient: Riley Ferguson  Procedure(s) Performed: CIRCUMCISION REVISION ADULT (Penis)  Patient Location: PACU  Anesthesia Type:General  Level of Consciousness: awake and patient cooperative  Airway & Oxygen Therapy: Patient Spontanous Breathing and Patient connected to nasal cannula oxygen  Post-op Assessment: Report given to RN and Post -op Vital signs reviewed and stable  Post vital signs: Reviewed and stable  Last Vitals:  Vitals Value Taken Time  BP 126/80 10/05/23 1502  Temp    Pulse 80 10/05/23 1504  Resp 23 10/05/23 1504  SpO2 98 % 10/05/23 1504  Vitals shown include unfiled device data.  Last Pain:  Vitals:   10/05/23 1127  TempSrc: Oral  PainSc: 0-No pain      Patients Stated Pain Goal: 5 (10/05/23 1127)  Complications: No notable events documented.

## 2023-10-05 NOTE — Anesthesia Postprocedure Evaluation (Signed)
Anesthesia Post Note  Patient: Oneal Collaso Johanning  Procedure(s) Performed: CIRCUMCISION REVISION ADULT (Penis)     Patient location during evaluation: PACU Anesthesia Type: General Level of consciousness: awake and alert, oriented and patient cooperative Pain management: pain level controlled Vital Signs Assessment: post-procedure vital signs reviewed and stable Respiratory status: spontaneous breathing, nonlabored ventilation and respiratory function stable Cardiovascular status: blood pressure returned to baseline and stable Postop Assessment: no apparent nausea or vomiting Anesthetic complications: no   No notable events documented.  Last Vitals:  Vitals:   10/05/23 1503 10/05/23 1515  BP: 126/80 114/80  Pulse: 78 79  Resp: (!) 21 (!) 21  Temp: 36.6 C   SpO2: 99% 92%    Last Pain:  Vitals:   10/05/23 1515  TempSrc:   PainSc: 0-No pain                 Lannie Fields

## 2023-10-05 NOTE — Discharge Instructions (Addendum)
Postoperative instructions for circumcision  Wound:  Remove the dressing the morning after surgery. In most cases your incision will have absorbable sutures that run along the course of your incision and will dissolve within the first 10-20 days. Some will fall out even earlier. Expect some redness as the sutures dissolved but this should occur only around the sutures. If there is generalized redness, especially with increasing pain or swelling, let us know. The penis will possibly get "black and blue" as the blood in the tissues spread. Sometimes the whole penis will turn colors. The black and blue is followed by a yellow and brown color. In time, all the discoloration will go away.  Diet:  You may return to your normal diet within 24 hours following your surgery. You may note some mild nausea and possibly vomiting the first 6-8 hours following surgery. This is usually due to the side effects of anesthesia, and will disappear quite soon. I would suggest clear liquids and a very light meal the first evening following your surgery.  Activity:  Your physical activity should be restricted the first 48 hours. During that time you should remain relatively inactive, moving about only when necessary. During the first 2 weeks following surgery he should avoid lifting any heavy objects (anything greater than 15 pounds), and avoid strenuous exercise. If you work, ask [place] specifically about your restrictions, both for work and home. We will write a note to your employer if needed.   Do not "use" your penis for 3 weeks or until the incisions are completely healed, whichever comes later.  Ice packs can be placed on and off over the penis for the first 48 hours to help relieve the pain and keep the swelling down. Frozen peas or corn in a ZipLock bag can be frozen, used and re-frozen. Fifteen minutes on and 15 minutes off is a reasonable schedule.  No sexual activity for 1 month.  Hygiene:  You may shower 24  hours after your surgery. Make sure wound is clean and dry afterwards. Tub bathing should be restricted until the seventh day.  Medication:  You will be sent home with some type of pain medication. In many cases you will be sent home with a narcotic pain pill (Vicodin or Tylox). If the pain is not too bad, you may take either Tylenol (acetaminophen) or Advil (ibuprofen) which contain no narcotic agents, and might be tolerated a little better, with fewer side effects. If the pain medication you are sent home with does not control the pain, you will have to let us know. Some narcotic pain medications cannot be given or refilled by a phone call to a pharmacy.  Problems you should report to [place]:  Fever of 101.0 degrees Fahrenheit or greater. Moderate or severe swelling under the skin incision or involving the scrotum. Drug reaction such as hives, a rash, nausea or vomiting.  Difficulty voiding        No acetaminophen/Tylenol until after 5:30 pm today if needed.     Post Anesthesia Home Care Instructions  Activity: Get plenty of rest for the remainder of the day. A responsible individual must stay with you for 24 hours following the procedure.  For the next 24 hours, DO NOT: -Drive a car -Advertising copywriter -Drink alcoholic beverages -Take any medication unless instructed by your physician -Make any legal decisions or sign important papers.  Meals: Start with liquid foods such as gelatin or soup. Progress to regular foods as tolerated. Avoid greasy, spicy,  heavy foods. If nausea and/or vomiting occur, drink only clear liquids until the nausea and/or vomiting subsides. Call your physician if vomiting continues.  Special Instructions/Symptoms: Your throat may feel dry or sore from the anesthesia or the breathing tube placed in your throat during surgery. If this causes discomfort, gargle with warm salt water. The discomfort should disappear within 24 hours.

## 2023-10-07 ENCOUNTER — Encounter (HOSPITAL_BASED_OUTPATIENT_CLINIC_OR_DEPARTMENT_OTHER): Payer: Self-pay | Admitting: Urology
